# Patient Record
Sex: Female | Born: 1995 | Race: White | Hispanic: No | Marital: Single | Smoking: Never smoker
Health system: Southern US, Community
[De-identification: ages and names within clinical notes are randomized; demographics above are authoritative.]

---
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
## 2016-01-22 ENCOUNTER — Other Ambulatory Visit: Payer: Self-pay | Admitting: Ophthalmology

## 2016-01-22 DIAGNOSIS — H47323 Drusen of optic disc, bilateral: Secondary | ICD-10-CM

## 2016-01-30 ENCOUNTER — Other Ambulatory Visit: Payer: BLUE CROSS/BLUE SHIELD

## 2016-02-06 ENCOUNTER — Ambulatory Visit
Admission: RE | Admit: 2016-02-06 | Discharge: 2016-02-06 | Disposition: A | Discharge status: Home / Self Care | Payer: 59 | Source: Ambulatory Visit | Attending: Ophthalmology | Admitting: Ophthalmology

## 2016-02-06 DIAGNOSIS — H47323 Drusen of optic disc, bilateral: Secondary | ICD-10-CM

## 2016-02-06 MED ORDER — GADOBENATE DIMEGLUMINE 529 MG/ML IV SOLN
10.0000 mL | Freq: Once | INTRAVENOUS | Status: AC | PRN
  Administered 2016-02-06: 10 mL via INTRAVENOUS

## 2018-03-09 IMAGING — MR MR HEAD WO/W CM
10 series · 48 of 48 positions shown · IV contrast (multihance)
Comparison: None.

CLINICAL DATA: 20-year-old female with drusen of both optic discs
discovered on routine eye exam. Asymptomatic. History of concussion
in April 2015.

EXAM:
MRI HEAD WITHOUT AND WITH CONTRAST
TECHNIQUE: Multiplanar, multiecho pulse sequences of the brain and surrounding
structures were obtained without and with intravenous contrast.
CONTRAST:  10mL MULTIHANCE GADOBENATE DIMEGLUMINE 529 MG/ML IV SOLN

[Series 5: T1 · sagittal · 4.0mm · 0.75mm/px · 3 of 31 slices shown (1 of 3)]
[im 1/31]
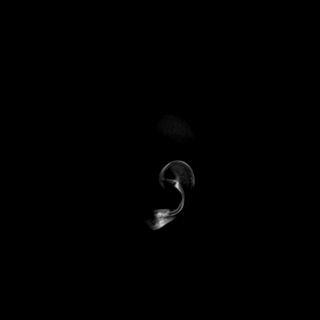
[im 16/31]
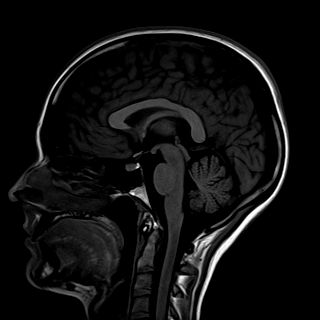
[im 31/31]
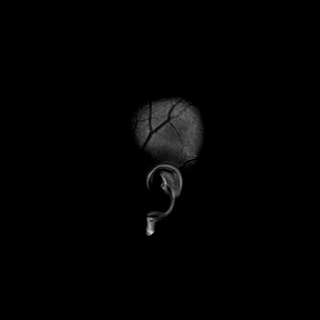

[Series 6: DWI · axial · 3.0mm · 1.44mm/px · z∈[-60,+76]mm · 7 of 86 slices shown (1 of 2)]
[im 1/86]
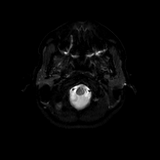
[im 15/86]
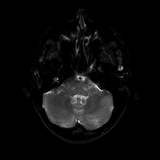
[im 29/86]
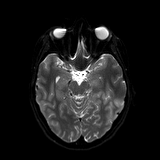
[im 43/86]
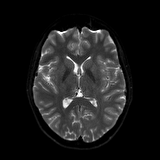
[im 57/86]
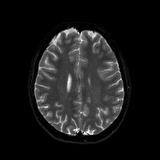
[im 71/86]
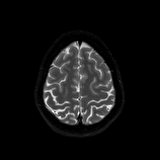
[im 86/86]
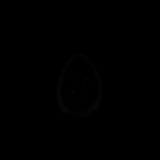

[Series 7: DWI · axial · 3.0mm · 1.44mm/px · z∈[-60,+76]mm · 3 of 42 slices shown (2 of 2)]
[im 1/42]
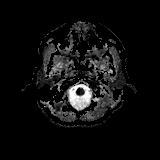
[im 21/42]
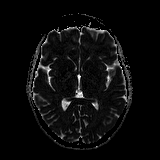
[im 42/42]
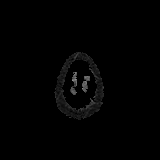

[Series 8: T2 · axial · 4.0mm · 0.36mm/px · z∈[-62,+76]mm · 2 of 28 slices shown]
[im 1/28]
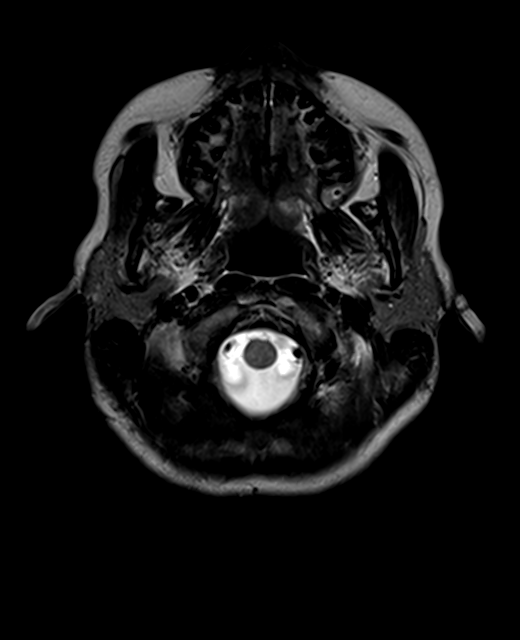
[im 28/28]
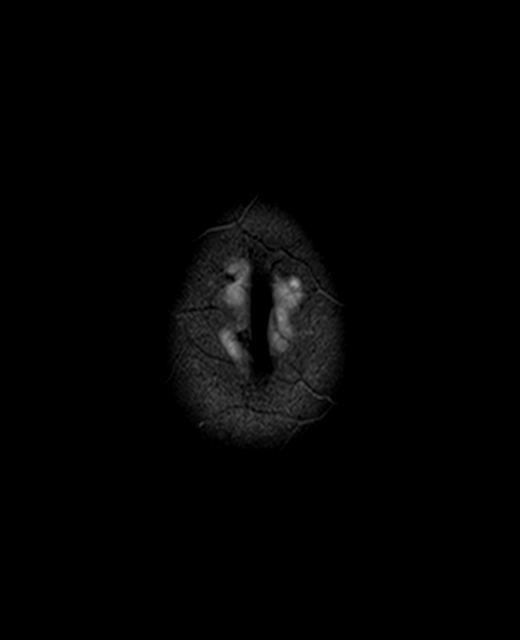

[Series 9: FLAIR · axial · 4.0mm · 0.72mm/px · z∈[-62,+76]mm · 2 of 28 slices shown]
[im 1/28]
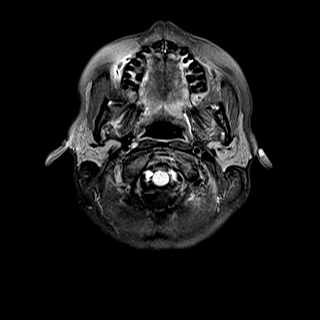
[im 28/28]
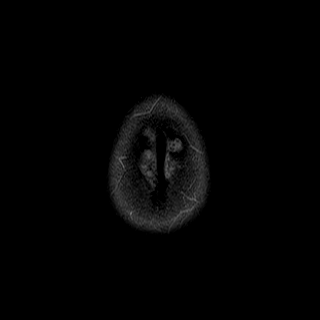

[Series 11: swi_images · axial · 1.5mm · 0.90mm/px · z∈[-63,+76]mm · 7 of 96 slices shown]
[im 1/96]
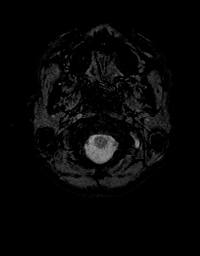
[im 16/96]
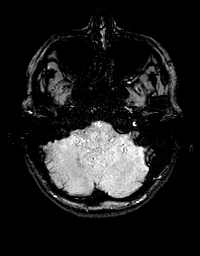
[im 32/96]
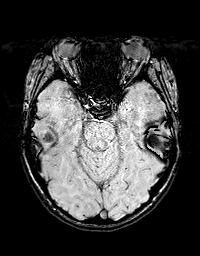
[im 48/96]
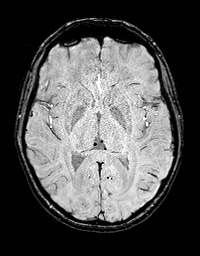
[im 64/96]
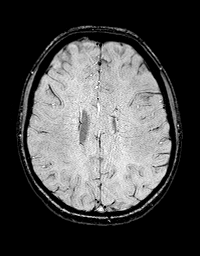
[im 80/96]
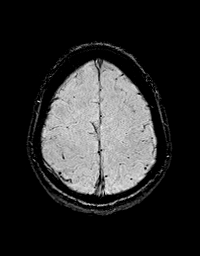
[im 96/96]
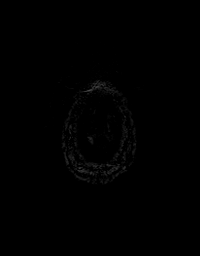

[Series 12: T1 · axial · 1.0mm · 0.90mm/px · z∈[-64,+77]mm · 10 of 144 slices shown (2 of 3)]
[im 1/144]
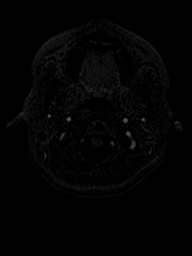
[im 16/144]
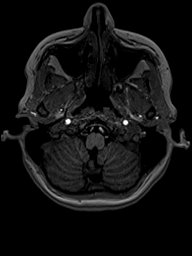
[im 32/144]
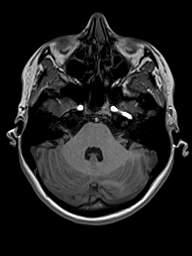
[im 48/144]
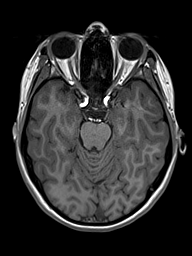
[im 64/144]
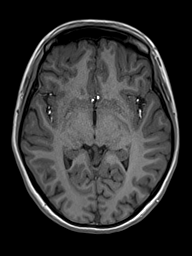
[im 80/144]
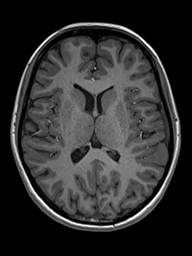
[im 96/144]
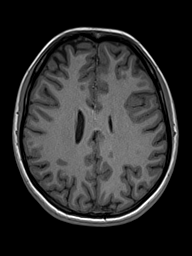
[im 112/144]
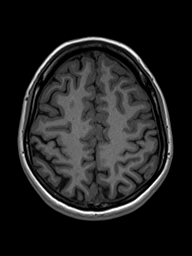
[im 128/144]
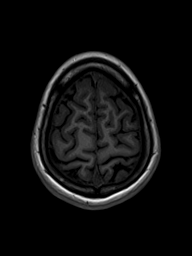
[im 144/144]
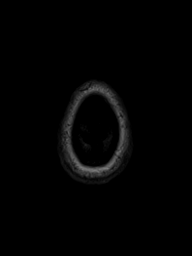

[Series 13: T2 post-contrast · coronal · 4.5mm · 0.36mm/px · 2 of 30 slices shown]
[im 1/30]
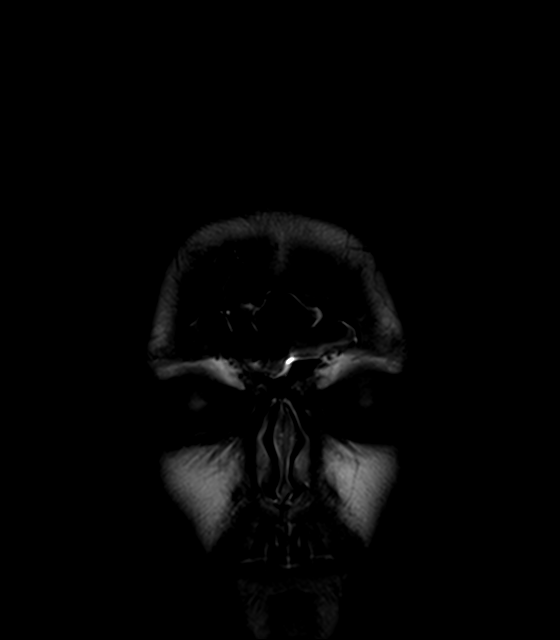
[im 30/30]
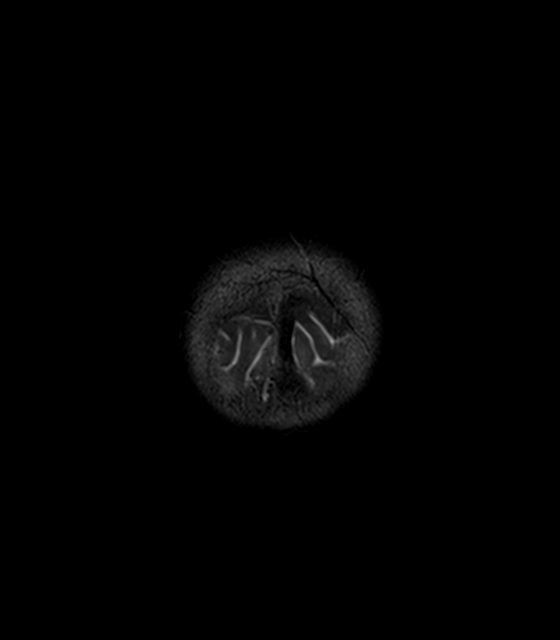

[Series 14: T1 · axial · 1.0mm · 0.90mm/px · z∈[-64,+77]mm · 10 of 144 slices shown (3 of 3)]
[im 1/144]
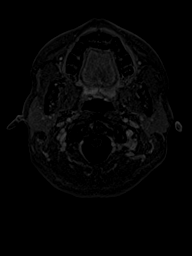
[im 16/144]
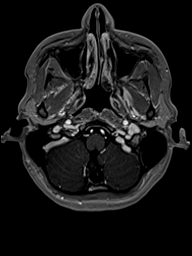
[im 32/144]
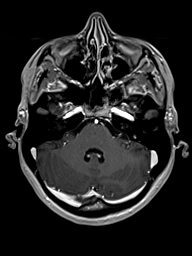
[im 48/144]
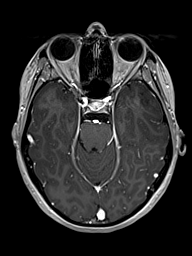
[im 64/144]
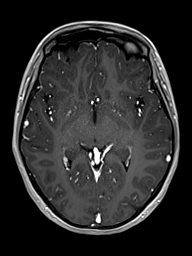
[im 80/144]
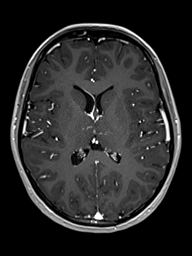
[im 96/144]
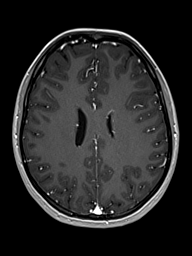
[im 112/144]
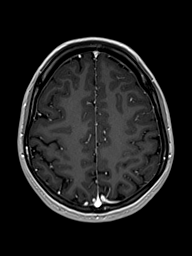
[im 128/144]
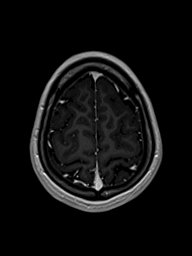
[im 144/144]
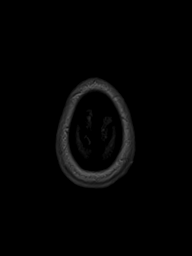

[Series 15: T1 post-contrast · coronal · 4.5mm · 0.72mm/px · 2 of 30 slices shown]
[im 1/30]
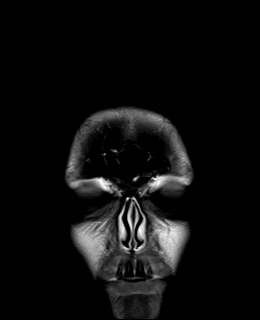
[im 30/30]
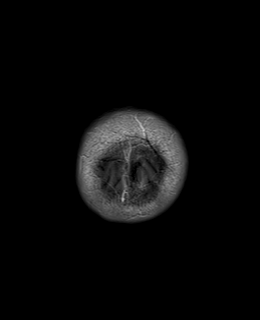

[48 of 48 positions shown; findings below may reference images not displayed]

FINDINGS: Brain: Cerebral volume is normal. No restricted diffusion to suggest
acute infarction. No midline shift, mass effect, evidence of mass
lesion, ventriculomegaly, extra-axial collection or acute
intracranial hemorrhage. Cervicomedullary junction and pituitary are
within normal limits.

Gray and white matter signal is within normal limits throughout the
brain. No encephalomalacia or chronic cerebral blood products. No
abnormal enhancement identified. No dural thickening.

Vascular: Major intracranial vascular flow voids appear normal.
Major dural venous sinuses are normally enhancing.

Skull and upper cervical spine: Negative. Visualized bone marrow
signal is within normal limits.

Sinuses/Orbits: The optic chiasm and both optic nerves appear normal
by MRI. The globes appear normal. Other intraorbital soft tissues
appear normal. Normal cavernous sinus.

Scattered mild bilateral paranasal sinus mucosal thickening. No
definite sinus fluid levels.

Other: Visible internal auditory structures appear normal. Mastoids
are clear. Negative scalp soft tissues.
IMPRESSION: 1. Normal MRI of the brain. Normal MRI appearance of the visualized
orbits.
2. Mild paranasal sinus inflammation.
